# Patient Record
Sex: Female | Born: 2000
Health system: Southern US, Community
[De-identification: ages and names within clinical notes are randomized; demographics above are authoritative.]

## PROBLEM LIST (undated history)

## (undated) DIAGNOSIS — E282 Polycystic ovarian syndrome: Secondary | ICD-10-CM

## (undated) DIAGNOSIS — E039 Hypothyroidism, unspecified: Secondary | ICD-10-CM

## (undated) HISTORY — PX: TONSILLECTOMY: SHX5217

## (undated) HISTORY — DX: Polycystic ovarian syndrome: E28.2

## (undated) HISTORY — DX: Hypothyroidism, unspecified: E03.9

---
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
## 2018-01-08 DIAGNOSIS — M25562 Pain in left knee: Secondary | ICD-10-CM | POA: Diagnosis not present

## 2018-01-08 DIAGNOSIS — R262 Difficulty in walking, not elsewhere classified: Secondary | ICD-10-CM | POA: Diagnosis not present

## 2018-01-08 DIAGNOSIS — M6281 Muscle weakness (generalized): Secondary | ICD-10-CM | POA: Diagnosis not present

## 2018-01-10 DIAGNOSIS — M25562 Pain in left knee: Secondary | ICD-10-CM | POA: Diagnosis not present

## 2018-01-10 DIAGNOSIS — M6281 Muscle weakness (generalized): Secondary | ICD-10-CM | POA: Diagnosis not present

## 2018-01-10 DIAGNOSIS — R262 Difficulty in walking, not elsewhere classified: Secondary | ICD-10-CM | POA: Diagnosis not present

## 2018-01-15 DIAGNOSIS — M6281 Muscle weakness (generalized): Secondary | ICD-10-CM | POA: Diagnosis not present

## 2018-01-15 DIAGNOSIS — R262 Difficulty in walking, not elsewhere classified: Secondary | ICD-10-CM | POA: Diagnosis not present

## 2018-01-15 DIAGNOSIS — M25562 Pain in left knee: Secondary | ICD-10-CM | POA: Diagnosis not present

## 2018-01-17 DIAGNOSIS — R262 Difficulty in walking, not elsewhere classified: Secondary | ICD-10-CM | POA: Diagnosis not present

## 2018-01-17 DIAGNOSIS — M25562 Pain in left knee: Secondary | ICD-10-CM | POA: Diagnosis not present

## 2018-01-17 DIAGNOSIS — M6281 Muscle weakness (generalized): Secondary | ICD-10-CM | POA: Diagnosis not present

## 2018-01-22 DIAGNOSIS — M6281 Muscle weakness (generalized): Secondary | ICD-10-CM | POA: Diagnosis not present

## 2018-01-22 DIAGNOSIS — R262 Difficulty in walking, not elsewhere classified: Secondary | ICD-10-CM | POA: Diagnosis not present

## 2018-01-22 DIAGNOSIS — M25562 Pain in left knee: Secondary | ICD-10-CM | POA: Diagnosis not present

## 2018-01-24 DIAGNOSIS — R262 Difficulty in walking, not elsewhere classified: Secondary | ICD-10-CM | POA: Diagnosis not present

## 2018-01-24 DIAGNOSIS — M25562 Pain in left knee: Secondary | ICD-10-CM | POA: Diagnosis not present

## 2018-01-24 DIAGNOSIS — M6281 Muscle weakness (generalized): Secondary | ICD-10-CM | POA: Diagnosis not present

## 2018-01-29 DIAGNOSIS — M25562 Pain in left knee: Secondary | ICD-10-CM | POA: Diagnosis not present

## 2018-01-29 DIAGNOSIS — M25662 Stiffness of left knee, not elsewhere classified: Secondary | ICD-10-CM | POA: Diagnosis not present

## 2018-01-29 DIAGNOSIS — M6281 Muscle weakness (generalized): Secondary | ICD-10-CM | POA: Diagnosis not present

## 2018-01-29 DIAGNOSIS — S83005A Unspecified dislocation of left patella, initial encounter: Secondary | ICD-10-CM | POA: Diagnosis not present

## 2018-01-31 DIAGNOSIS — M6281 Muscle weakness (generalized): Secondary | ICD-10-CM | POA: Diagnosis not present

## 2018-01-31 DIAGNOSIS — M25562 Pain in left knee: Secondary | ICD-10-CM | POA: Diagnosis not present

## 2018-01-31 DIAGNOSIS — R262 Difficulty in walking, not elsewhere classified: Secondary | ICD-10-CM | POA: Diagnosis not present

## 2018-02-06 DIAGNOSIS — M6281 Muscle weakness (generalized): Secondary | ICD-10-CM | POA: Diagnosis not present

## 2018-02-06 DIAGNOSIS — M25562 Pain in left knee: Secondary | ICD-10-CM | POA: Diagnosis not present

## 2018-02-06 DIAGNOSIS — R262 Difficulty in walking, not elsewhere classified: Secondary | ICD-10-CM | POA: Diagnosis not present

## 2018-02-08 DIAGNOSIS — M25562 Pain in left knee: Secondary | ICD-10-CM | POA: Diagnosis not present

## 2018-02-08 DIAGNOSIS — R262 Difficulty in walking, not elsewhere classified: Secondary | ICD-10-CM | POA: Diagnosis not present

## 2018-02-08 DIAGNOSIS — M6281 Muscle weakness (generalized): Secondary | ICD-10-CM | POA: Diagnosis not present

## 2018-02-13 DIAGNOSIS — R262 Difficulty in walking, not elsewhere classified: Secondary | ICD-10-CM | POA: Diagnosis not present

## 2018-02-13 DIAGNOSIS — M25562 Pain in left knee: Secondary | ICD-10-CM | POA: Diagnosis not present

## 2018-02-13 DIAGNOSIS — M6281 Muscle weakness (generalized): Secondary | ICD-10-CM | POA: Diagnosis not present

## 2018-03-05 ENCOUNTER — Ambulatory Visit: Payer: 59 | Admitting: Family Medicine

## 2018-03-05 ENCOUNTER — Encounter: Payer: Self-pay | Admitting: Family Medicine

## 2018-03-05 DIAGNOSIS — H6121 Impacted cerumen, right ear: Secondary | ICD-10-CM

## 2018-03-05 DIAGNOSIS — H6120 Impacted cerumen, unspecified ear: Secondary | ICD-10-CM | POA: Insufficient documentation

## 2018-03-05 DIAGNOSIS — H6982 Other specified disorders of Eustachian tube, left ear: Secondary | ICD-10-CM | POA: Diagnosis not present

## 2018-03-05 DIAGNOSIS — H6980 Other specified disorders of Eustachian tube, unspecified ear: Secondary | ICD-10-CM | POA: Insufficient documentation

## 2018-03-05 MED ORDER — FLUTICASONE PROPIONATE 50 MCG/ACT NA SUSP: 2.0000 | Freq: Every day | NASAL | 1 refills | Status: DC

## 2018-03-05 NOTE — Assessment & Plan Note (Signed)
Right side - with intermittent pain  Suspect early uri or allergies H/o tubes as a child   Px flonase to use 2-4 wk and then prn  Ibuprofen prn  Update if not starting to improve in a week or if worsening

## 2018-03-05 NOTE — Patient Instructions (Signed)
I think you have middle ear fluid (ETD)  This may be from allergies or an early cold Try flonase daily for 2-4 weeks  Also lozenges with zinc may help for the first 2 days   Drink fluids Get rest   For ear pain - ibuprofen over the counter  Update if not starting to improve in a week or if worsening

## 2018-03-05 NOTE — Progress Notes (Signed)
Subjective:    Patient ID: Angie Watkins, female    DOB: [birth year], 18 y.o.   MRN: [id number]  HPI 18 yo pt of NP [facility] with ear pain and nausea /hot feeling   Pulse 108 Temp 98.7  Woke up yesterday am with pain in L ear  Warm wash cloth helped  It continued - but now improved  May have some drainage in throat  Feels muffled- sound /feels full also   Nauseated- makes her hot   No cough  No fever or chills or aches   Had a headache yesterday- better now (was on L as well)   No chance pregnant  No missed period No missed OC pills    Patient Active Problem List   Diagnosis Date Noted  . ETD (eustachian tube dysfunction) 03/05/2018  . Cerumen impaction 03/05/2018   History reviewed. No pertinent past medical history. History reviewed. No pertinent surgical history. Social History   Tobacco Use  . Smoking status: Never Smoker  . Smokeless tobacco: Never Used  Substance Use Topics  . Alcohol use: Never    Frequency: Never  . Drug use: Never   Family History  Problem Relation Age of Onset  . Hypertension Mother   . Asthma Father   . Diabetes Father    No Known Allergies Current Outpatient Medications on File Prior to Visit  Medication Sig Dispense Refill  . levothyroxine (SYNTHROID, LEVOTHROID) 50 MCG tablet Take 1 tablet (50 mcg total) by mouth daily. 90 tablet 3  . norgestimate-ethinyl estradiol (ORTHO-CYCLEN,SPRINTEC,PREVIFEM) 0.25-35 MG-MCG tablet Take 1 tablet by mouth daily. 3 Package 4   No current facility-administered medications on file prior to visit.     Review of Systems  Constitutional: Negative for activity change, appetite change, fatigue, fever and unexpected weight change.  HENT: Positive for ear pain, hearing loss and postnasal drip. Negative for congestion, ear discharge, facial swelling, rhinorrhea, sinus pressure, sinus pain, sore throat, trouble swallowing and voice change.   Eyes: Negative for pain, redness and visual disturbance.    Respiratory: Negative for cough, shortness of breath, wheezing and stridor.   Cardiovascular: Negative for chest pain and palpitations.  Gastrointestinal: Positive for nausea. Negative for abdominal pain, blood in stool, constipation and diarrhea.       Was nauseated This resolved   Endocrine: Negative for polydipsia and polyuria.  Genitourinary: Negative for dysuria, frequency and urgency.  Musculoskeletal: Negative for arthralgias, back pain and myalgias.  Skin: Negative for pallor and rash.  Allergic/Immunologic: Negative for environmental allergies.  Neurological: Negative for dizziness, syncope, light-headedness and headaches.       Was dizzy This is resolved   Hematological: Negative for adenopathy. Does not bruise/bleed easily.  Psychiatric/Behavioral: Negative for decreased concentration and dysphoric mood. The patient is not nervous/anxious.        Objective:   Physical Exam Constitutional:      General: She is not in acute distress.    Appearance: Normal appearance. She is obese. She is not diaphoretic.  HENT:     Head: Normocephalic and atraumatic.     Right Ear: There is impacted cerumen.     Left Ear: Ear canal normal. There is no impacted cerumen.     Ears:     Comments: R total cerumen impaction   L - TM is dull with effusion  Clear canal      Nose: Rhinorrhea present.     Mouth/Throat:     Mouth: Mucous membranes are moist.  Pharynx: Oropharynx is clear. No posterior oropharyngeal erythema.     Comments: pnd Eyes:     General: No scleral icterus.    Extraocular Movements: Extraocular movements intact.     Right eye: No nystagmus.     Left eye: No nystagmus.     Conjunctiva/sclera: Conjunctivae normal.     Pupils: Pupils are equal, round, and reactive to light.  Neck:     Musculoskeletal: Normal range of motion. No neck rigidity.     Vascular: No carotid bruit.  Cardiovascular:     Rate and Rhythm: Regular rhythm. Tachycardia present.  Pulmonary:      Effort: Pulmonary effort is normal.     Breath sounds: Normal breath sounds. No wheezing.  Lymphadenopathy:     Cervical: No cervical adenopathy.  Neurological:     General: No focal deficit present.     Mental Status: She is alert.     Cranial Nerves: No cranial nerve deficit.     Coordination: Coordination normal.  Psychiatric:        Mood and Affect: Mood normal.           Assessment & Plan:   Problem List Items Addressed This Visit      Nervous and Auditory   ETD (eustachian tube dysfunction)    Right side - with intermittent pain  Suspect early uri or allergies H/o tubes as a child   Px flonase to use 2-4 wk and then prn  Ibuprofen prn  Update if not starting to improve in a week or if worsening         Cerumen impaction    R ear  Incidental finding  inst to get debrox or similar agent otc and use as directed F/u for irrigation if needed in the future

## 2018-03-05 NOTE — Assessment & Plan Note (Signed)
R ear  Incidental finding  inst to get debrox or similar agent otc and use as directed F/u for irrigation if needed in the future

## 2018-03-27 ENCOUNTER — Other Ambulatory Visit: Payer: Self-pay | Admitting: Family Medicine

## 2018-05-03 ENCOUNTER — Other Ambulatory Visit: Payer: Self-pay | Admitting: Family Medicine

## 2018-08-17 ENCOUNTER — Other Ambulatory Visit: Payer: Self-pay | Admitting: Family Medicine

## 2018-08-17 DIAGNOSIS — E039 Hypothyroidism, unspecified: Secondary | ICD-10-CM

## 2018-09-07 ENCOUNTER — Encounter: Payer: Self-pay | Admitting: Family Medicine

## 2018-09-07 ENCOUNTER — Other Ambulatory Visit: Payer: Self-pay

## 2018-09-07 ENCOUNTER — Ambulatory Visit (INDEPENDENT_AMBULATORY_CARE_PROVIDER_SITE_OTHER): Payer: 59 | Admitting: Family Medicine

## 2018-09-07 VITALS — BP 118/78 | HR 97 | Temp 98.7°F | Resp 18 | Ht 64.5 in | Wt 247.2 lb

## 2018-09-07 DIAGNOSIS — E039 Hypothyroidism, unspecified: Secondary | ICD-10-CM | POA: Diagnosis not present

## 2018-09-07 DIAGNOSIS — Z0000 Encounter for general adult medical examination without abnormal findings: Secondary | ICD-10-CM | POA: Diagnosis not present

## 2018-09-07 DIAGNOSIS — E559 Vitamin D deficiency, unspecified: Secondary | ICD-10-CM | POA: Diagnosis not present

## 2018-09-07 DIAGNOSIS — E282 Polycystic ovarian syndrome: Secondary | ICD-10-CM | POA: Diagnosis not present

## 2018-09-07 DIAGNOSIS — E611 Iron deficiency: Secondary | ICD-10-CM

## 2018-09-07 DIAGNOSIS — R221 Localized swelling, mass and lump, neck: Secondary | ICD-10-CM

## 2018-09-07 MED ORDER — NORETHINDRONE-ETH ESTRADIOL 1-35 MG-MCG PO TABS: 1.0000 | ORAL_TABLET | Freq: Every day | ORAL | 4 refills | Status: DC

## 2018-09-07 NOTE — Progress Notes (Signed)
Subjective:    Patient ID: Angie Watkins, female    DOB: 05/08/2000, 18 y.o.   MRN: [id number]  HPI This is a 18 yo female who presents today for annual exam. Doing ok, doing online classes, working at a pool.   Last CPE- last year Tdap- 10/19/2009 Flu- not every year Eye- regular Dental- regular Exercise- walking, was going to the gym pre-pandemic Diet- has been watching her diet, making better food choices  Hypothyroidism- has been off her levothyroxine for about a month. Feels full in her neck.   PCOS- taking her pills regularly until last month. Increased flow x 3 days, regular, occasional breakthrough bleeding.   Headaches- more frequent, frontal, no light or sound sensitivity, doesn't usually take anything. Usually goes away with in a couple of hours. Thinks related to her thyroid.   No past medical history on file. No past surgical history on file. Family History  Problem Relation Age of Onset  . Hypertension Mother   . Asthma Father   . Diabetes Father    Social History   Tobacco Use  . Smoking status: Never Smoker  . Smokeless tobacco: Never Used  Substance Use Topics  . Alcohol use: Never    Frequency: Never  . Drug use: Never      Review of Systems  Constitutional: Negative.   HENT: Negative.   Eyes: Negative.   Respiratory: Negative.   Cardiovascular: Negative.   Gastrointestinal: Negative.   Endocrine: Negative.   Genitourinary: Negative.   Musculoskeletal: Positive for neck pain (occaional with frontal fullness).  Skin: Negative.   Allergic/Immunologic: Negative.   Neurological: Negative.   Hematological: Negative.   Psychiatric/Behavioral: Negative.        Objective:   Physical Exam Physical Exam  Constitutional: She is oriented to person, place, and time. She appears well-developed and well-nourished. No distress. Obese. HENT:  Head: Normocephalic and atraumatic.  Right Ear: External ear normal.  Left Ear: External ear normal.   Nose: Nose normal.  Mouth/Throat: Oropharynx is clear and moist. No oropharyngeal exudate.  Eyes: Conjunctivae are normal. Pupils are equal, round, and reactive to light.  Neck: Normal range of motion. Neck supple. No JVD present. No thyromegaly present.  Cardiovascular: Normal rate, regular rhythm, normal heart sounds and intact distal pulses.   Pulmonary/Chest: Effort normal and breath sounds normal. Right breast exhibits no inverted nipple, no mass, no nipple discharge, no skin change and no tenderness. Left breast exhibits no inverted nipple, no mass, no nipple discharge, no skin change and no tenderness. Breasts are symmetrical.  Abdominal: Soft. Bowel sounds are normal. She exhibits no distension and no mass. There is no tenderness. There is no rebound and no guarding.  Musculoskeletal: Normal range of motion. She exhibits no edema or tenderness.  Lymphadenopathy:    She has no cervical adenopathy.  Neurological: She is alert and oriented to person, place, and time. She has normal reflexes.  Skin: Skin is warm and dry. She is not diaphoretic.  Psychiatric: She has a normal mood and affect. Her behavior is normal. Judgment and thought content normal.  Vitals reviewed.   BP 118/78   Pulse 97   Temp 98.7 F (37.1 C)   Resp 18   Ht 5' 4.5" (1.638 m)   Wt 247 lb 4 oz (112.2 kg)   LMP 08/22/2018   BMI 41.78 kg/m      Wt Readings from Last 3 Encounters:  09/07/18 247 lb 4 oz (112.2 kg) (>99 %, Z=  2.49)*  03/05/18 268 lb 8 oz (121.8 kg) (>99 %, Z= 2.63)*  11/13/17 272 lb 12.8 oz (123.7 kg) (>99 %, Z= 2.64)*   * Growth percentiles are based on CDC (Girls, 2-20 Years) data.   Depression screen [facility] 2/9 09/07/2018 09/04/2017  Decreased Interest 0 0  Down, Depressed, Hopeless 0 0  PHQ - 2 Score 0 0    Assessment & Plan:  1. Annual physical exam - Discussed and encouraged healthy lifestyle choices- adequate sleep, regular exercise, stress management and healthy food choices.  -  Working on weight loss and has lost 25 pounds  2. PCOS (polycystic ovarian syndrome) - menses continue to be heavy, will change her OCPs, she will let me know if not improved with 3 packs - CBC with Differential/Platelet - Lipid panel - VITAMIN D 25 Hydroxy (Vit-D Deficiency, Fractures) - Hemoglobin A1c - TSH - norethindrone-ethinyl estradiol 1/35 (ORTHO-NOVUM) tablet; Take 1 tablet by mouth daily.  Dispense: 3 Package; Refill: 4  3. Vitamin D deficiency - CBC with Differential/Platelet - Lipid panel - VITAMIN D 25 Hydroxy (Vit-D Deficiency, Fractures) - Hemoglobin A1c - TSH  4. Hypothyroidism, unspecified type - discussed strategies for improved medication compliance - CBC with Differential/Platelet - Lipid panel - VITAMIN D 25 Hydroxy (Vit-D Deficiency, Fractures) - Hemoglobin A1c - TSH  5. Low iron - CBC with Differential/Platelet - Lipid panel - VITAMIN D 25 Hydroxy (Vit-D Deficiency, Fractures) - Hemoglobin A1c - TSH  6. Neck fullness - I did not appreciate fullness on exam, this comes goes per patient and area of anterior neck feels inflammed - US THYROID; Future - CBC with Differential/Platelet - Lipid panel - VITAMIN D 25 Hydroxy (Vit-D Deficiency, Fractures) - Hemoglobin A1c - TSH   Clarene Reamer, FNP-BC  [facility]  09/07/2018 5:03 PM

## 2018-09-07 NOTE — Patient Instructions (Signed)
Good to see you today- keep up the good work with your healthy food choices!  I will send you a message about your lab work  I have sent in a prescription for a new birth control pill- just start the new pack when you finish the old one. Let me know how your periods are doing after 3 packs.    Health Maintenance, Female Adopting a healthy lifestyle and getting preventive care are important in promoting health and wellness. Ask your health care provider about:  The right schedule for you to have regular tests and exams.  Things you can do on your own to prevent diseases and keep yourself healthy. What should I know about diet, weight, and exercise? Eat a healthy diet   Eat a diet that includes plenty of vegetables, fruits, low-fat dairy products, and lean protein.  Do not eat a lot of foods that are high in solid fats, added sugars, or sodium. Maintain a healthy weight Body mass index (BMI) is used to identify weight problems. It estimates body fat based on height and weight. Your health care provider can help determine your BMI and help you achieve or maintain a healthy weight. Get regular exercise Get regular exercise. This is one of the most important things you can do for your health. Most adults should:  Exercise for at least 150 minutes each week. The exercise should increase your heart rate and make you sweat (moderate-intensity exercise).  Do strengthening exercises at least twice a week. This is in addition to the moderate-intensity exercise.  Spend less time sitting. Even light physical activity can be beneficial. Watch cholesterol and blood lipids Have your blood tested for lipids and cholesterol at 20 years of age, then have this test every 5 years. Have your cholesterol levels checked more often if:  Your lipid or cholesterol levels are high.  You are older than 20 years of age.  You are at high risk for heart disease. What should I know about cancer screening?  Depending on your health history and family history, you may need to have cancer screening at various ages. This may include screening for:  Breast cancer.  Cervical cancer.  Colorectal cancer.  Skin cancer.  Lung cancer. What should I know about heart disease, diabetes, and high blood pressure? Blood pressure and heart disease  High blood pressure causes heart disease and increases the risk of stroke. This is more likely to develop in people who have high blood pressure readings, are of African descent, or are overweight.  Have your blood pressure checked: ? Every 3-5 years if you are 63-66 years of age. ? Every year if you are 26 years old or older. Diabetes Have regular diabetes screenings. This checks your fasting blood sugar level. Have the screening done:  Once every three years after age 109 if you are at a normal weight and have a low risk for diabetes.  More often and at a younger age if you are overweight or have a high risk for diabetes. What should I know about preventing infection? Hepatitis B If you have a higher risk for hepatitis B, you should be screened for this virus. Talk with your health care provider to find out if you are at risk for hepatitis B infection. Hepatitis C Testing is recommended for:  Everyone born from [birth year] through 1967.  Anyone with known risk factors for hepatitis C. Sexually transmitted infections (STIs)  Get screened for STIs, including gonorrhea and chlamydia, if: ? You  are sexually active and are younger than 20 years of age. ? You are older than 20 years of age and your health care provider tells you that you are at risk for this type of infection. ? Your sexual activity has changed since you were last screened, and you are at increased risk for chlamydia or gonorrhea. Ask your health care provider if you are at risk.  Ask your health care provider about whether you are at high risk for HIV. Your health care provider may recommend a  prescription medicine to help prevent HIV infection. If you choose to take medicine to prevent HIV, you should first get tested for HIV. You should then be tested every 3 months for as long as you are taking the medicine. Pregnancy  If you are about to stop having your period (premenopausal) and you may become pregnant, seek counseling before you get pregnant.  Take 400 to 800 micrograms (mcg) of folic acid every day if you become pregnant.  Ask for birth control (contraception) if you want to prevent pregnancy. Osteoporosis and menopause Osteoporosis is a disease in which the bones lose minerals and strength with aging. This can result in bone fractures. If you are 42 years old or older, or if you are at risk for osteoporosis and fractures, ask your health care provider if you should:  Be screened for bone loss.  Take a calcium or vitamin D supplement to lower your risk of fractures.  Be given hormone replacement therapy (HRT) to treat symptoms of menopause. Follow these instructions at home: Lifestyle  Do not use any products that contain nicotine or tobacco, such as cigarettes, e-cigarettes, and chewing tobacco. If you need help quitting, ask your health care provider.  Do not use street drugs.  Do not share needles.  Ask your health care provider for help if you need support or information about quitting drugs. Alcohol use  Do not drink alcohol if: ? Your health care provider tells you not to drink. ? You are pregnant, may be pregnant, or are planning to become pregnant.  If you drink alcohol: ? Limit how much you use to 0-1 drink a day. ? Limit intake if you are breastfeeding.  Be aware of how much alcohol is in your drink. In the [place] one drink equals one 12 oz bottle of beer (355 mL), one 5 oz glass of wine (148 mL), or one 1 oz glass of hard liquor (44 mL). General instructions  Schedule regular health, dental, and eye exams.  Stay current with your vaccines.   Tell your health care provider if: ? You often feel depressed. ? You have ever been abused or do not feel safe at home. Summary  Adopting a healthy lifestyle and getting preventive care are important in promoting health and wellness.  Follow your health care provider's instructions about healthy diet, exercising, and getting tested or screened for diseases.  Follow your health care provider's instructions on monitoring your cholesterol and blood pressure. This information is not intended to replace advice given to you by your health care provider. Make sure you discuss any questions you have with your health care provider. Document Released: 08/30/2010 Document Revised: 02/07/2018 Document Reviewed: 02/07/2018 Elsevier Patient Education  2020 [employer].

## 2018-09-08 LAB — CBC WITH DIFFERENTIAL/PLATELET
Absolute Monocytes: 629 cells/uL (ref 200–950)
Basophils Absolute: 51 cells/uL (ref 0–200)
Basophils Relative: 0.6 %
Eosinophils Absolute: 34 cells/uL (ref 15–500)
Eosinophils Relative: 0.4 %
HCT: 41.9 % (ref 35.0–45.0)
Hemoglobin: 13.6 g/dL (ref 11.7–15.5)
Lymphs Abs: 2797 cells/uL (ref 850–3900)
MCH: 27.6 pg (ref 27.0–33.0)
MCHC: 32.5 g/dL (ref 32.0–36.0)
MCV: 85.2 fL (ref 80.0–100.0)
MPV: 11.6 fL (ref 7.5–12.5)
Monocytes Relative: 7.4 %
Neutro Abs: 4990 cells/uL (ref 1500–7800)
Neutrophils Relative %: 58.7 %
Platelets: 302 10*3/uL (ref 140–400)
RBC: 4.92 10*6/uL (ref 3.80–5.10)
RDW: 13 % (ref 11.0–15.0)
Total Lymphocyte: 32.9 %
WBC: 8.5 10*3/uL (ref 3.8–10.8)

## 2018-09-08 LAB — HEMOGLOBIN A1C
Hgb A1c MFr Bld: 5.1 % of total Hgb (ref <5.7)
Mean Plasma Glucose: 100 (calc)
eAG (mmol/L): 5.5 (calc)

## 2018-09-08 LAB — LIPID PANEL
Cholesterol: 166 mg/dL (ref <170)
HDL: 46 mg/dL (ref >45)
LDL Cholesterol (Calc): 105 mg/dL (calc) (ref <110)
Non-HDL Cholesterol (Calc): 120 mg/dL (calc) — ABNORMAL HIGH (ref <120)
Total CHOL/HDL Ratio: 3.6 (calc) (ref <5.0)
Triglycerides: 66 mg/dL (ref <90)

## 2018-09-08 LAB — TSH: TSH: 2.96 mIU/L

## 2018-09-08 LAB — VITAMIN D 25 HYDROXY (VIT D DEFICIENCY, FRACTURES): Vit D, 25-Hydroxy: 35 ng/mL (ref 30–100)

## 2018-09-10 ENCOUNTER — Telehealth: Payer: Self-pay | Admitting: Family Medicine

## 2018-09-10 ENCOUNTER — Encounter: Payer: Self-pay | Admitting: Family Medicine

## 2018-09-10 NOTE — Telephone Encounter (Signed)
Patient scheduled 3 month labs.  And would like to know if you want her to fast for those     C/B # [phone]

## 2018-09-11 NOTE — Telephone Encounter (Signed)
Please call her and let her know that she does not need to fast.

## 2018-09-12 NOTE — Telephone Encounter (Signed)
Sent message through mychart

## 2018-09-17 ENCOUNTER — Encounter: Payer: Self-pay | Admitting: Family Medicine

## 2018-09-17 ENCOUNTER — Ambulatory Visit
Admission: RE | Admit: 2018-09-17 | Discharge: 2018-09-17 | Disposition: A | Discharge status: Home / Self Care | Payer: 59 | Source: Ambulatory Visit | Attending: Family Medicine | Admitting: Family Medicine

## 2018-09-17 DIAGNOSIS — R221 Localized swelling, mass and lump, neck: Secondary | ICD-10-CM

## 2018-12-04 ENCOUNTER — Other Ambulatory Visit: Payer: Self-pay | Admitting: Family Medicine

## 2018-12-04 DIAGNOSIS — E039 Hypothyroidism, unspecified: Secondary | ICD-10-CM

## 2018-12-06 ENCOUNTER — Telehealth: Payer: Self-pay

## 2018-12-06 NOTE — Telephone Encounter (Signed)
Left detailed VM w COVID screen and back door lab info   

## 2018-12-07 ENCOUNTER — Other Ambulatory Visit: Payer: 59

## 2018-12-10 ENCOUNTER — Other Ambulatory Visit: Payer: 59

## 2018-12-10 ENCOUNTER — Other Ambulatory Visit: Payer: Self-pay | Admitting: Family Medicine

## 2018-12-10 DIAGNOSIS — E039 Hypothyroidism, unspecified: Secondary | ICD-10-CM

## 2018-12-11 ENCOUNTER — Other Ambulatory Visit: Payer: 59

## 2018-12-26 ENCOUNTER — Encounter: Payer: Self-pay | Admitting: Family Medicine

## 2019-01-28 ENCOUNTER — Other Ambulatory Visit: Payer: 59

## 2019-01-29 ENCOUNTER — Other Ambulatory Visit: Payer: Self-pay

## 2019-01-29 ENCOUNTER — Other Ambulatory Visit (INDEPENDENT_AMBULATORY_CARE_PROVIDER_SITE_OTHER): Payer: 59

## 2019-01-29 DIAGNOSIS — E039 Hypothyroidism, unspecified: Secondary | ICD-10-CM | POA: Diagnosis not present

## 2019-01-30 ENCOUNTER — Other Ambulatory Visit: Payer: Self-pay | Admitting: Family Medicine

## 2019-01-30 ENCOUNTER — Encounter: Payer: Self-pay | Admitting: Family Medicine

## 2019-01-30 LAB — TSH: TSH: 2.39 u[IU]/mL (ref 0.35–5.50)

## 2019-03-07 ENCOUNTER — Telehealth: Payer: Self-pay | Admitting: Radiology

## 2019-03-07 NOTE — Telephone Encounter (Signed)
Patient called with concerns about a venipuncter site from Dec, 1. She said, it never discolored but did have a bump at the site. It still is sore at times with the touch.  I told her it should have been resolved by this time. I asked if she wanted an appt or a phone note to be sent to D. Leone Payor. Patient asked to do the phone note at this point.

## 2019-03-08 NOTE — Telephone Encounter (Signed)
Please call patient and see if area of concern is improving, getting worse or staying the same? She can apply warm compresses. If pain persists or worsens, will need to be seen in the office.

## 2019-03-08 NOTE — Telephone Encounter (Signed)
Noted  

## 2019-03-08 NOTE — Telephone Encounter (Signed)
Spoke with patient, she is going to start warm compresses today and do them off and on all weekend and will call on Monday is not seeing any improvement.   Pt states that there is no discoloration to the area, bump is not visible to the eye it can only be palpated. Pt states that its very tender to the touch, and when pressed on is very painful and takes a while to stop hurting after being messed with. Pt denies any pain with bending at her elbow, no known discomfort when lifting heavy objects. I advised that if site worsens or becomes visibly worse then to send Angie Watkins a picture on mychart.   Patient will call and give an update next week.

## 2019-08-26 ENCOUNTER — Encounter: Payer: Self-pay | Admitting: Family Medicine

## 2019-08-30 ENCOUNTER — Encounter: Payer: Self-pay | Admitting: Family Medicine

## 2019-08-30 ENCOUNTER — Other Ambulatory Visit: Payer: Self-pay

## 2019-08-30 ENCOUNTER — Ambulatory Visit (INDEPENDENT_AMBULATORY_CARE_PROVIDER_SITE_OTHER): Payer: No Typology Code available for payment source | Admitting: Family Medicine

## 2019-08-30 VITALS — BP 110/80 | HR 84 | Temp 97.7°F | Wt 247.8 lb

## 2019-08-30 DIAGNOSIS — K219 Gastro-esophageal reflux disease without esophagitis: Secondary | ICD-10-CM | POA: Diagnosis not present

## 2019-08-30 DIAGNOSIS — G43009 Migraine without aura, not intractable, without status migrainosus: Secondary | ICD-10-CM | POA: Diagnosis not present

## 2019-08-30 MED ORDER — SUMATRIPTAN SUCCINATE 50 MG PO TABS: ORAL_TABLET | ORAL | 1 refills | Status: DC

## 2019-08-30 NOTE — Patient Instructions (Addendum)
Good to see you today  Get your sumatriptan filled- take one tablet and 2 over the counter naproxen with food and a full glass of water, can repeat sumatriptan in 2 hours if needed.   Increase water intake, can drink G2, Propel, Smart Water, etc  If not better in a couple of days, let me know  Keep a log of your headaches- when they occur, how long they last, what makes them go away  You tube channels- Dr. Wylene SimmerJason Fung, Dr. Shanda HowellsEric Westman  Migraine Headache A migraine headache is a very strong throbbing pain on one side or both sides of your head. This type of headache can also cause other symptoms. It can last from 4 hours to 3 days. Talk with your doctor about what things may bring on (trigger) this condition. What are the causes? The exact cause of this condition is not known. This condition may be triggered or caused by:  Drinking alcohol.  Smoking.  Taking medicines, such as: ? Medicine used to treat chest pain (nitroglycerin). ? Birth control pills. ? Estrogen. ? Some blood pressure medicines.  Eating or drinking certain products.  Doing physical activity. Other things that may trigger a migraine headache include:  Having a menstrual period.  Pregnancy.  Hunger.  Stress.  Not getting enough sleep or getting too much sleep.  Weather changes.  Tiredness (fatigue). What increases the risk?  Being 4725-21 years old.  Being female.  Having a family history of migraine headaches.  Being Caucasian.  Having depression or anxiety.  Being very overweight. What are the signs or symptoms?  A throbbing pain. This pain may: ? Happen in any area of the head, such as on one side or both sides. ? Make it hard to do daily activities. ? Get worse with physical activity. ? Get worse around bright lights or loud noises.  Other symptoms may include: ? Feeling sick to your stomach (nauseous). ? Vomiting. ? Dizziness. ? Being sensitive to bright lights, loud noises, or  smells.  Before you get a migraine headache, you may get warning signs (an aura). An aura may include: ? Seeing flashing lights or having blind spots. ? Seeing bright spots, halos, or zigzag lines. ? Having tunnel vision or blurred vision. ? Having numbness or a tingling feeling. ? Having trouble talking. ? Having weak muscles.  Some people have symptoms after a migraine headache (postdromal phase), such as: ? Tiredness. ? Trouble thinking (concentrating). How is this treated?  Taking medicines that: ? Relieve pain. ? Relieve the feeling of being sick to your stomach. ? Prevent migraine headaches.  Treatment may also include: ? Having acupuncture. ? Avoiding foods that bring on migraine headaches. ? Learning ways to control your body functions (biofeedback). ? Therapy to help you know and deal with negative thoughts (cognitive behavioral therapy). Follow these instructions at home: Medicines  Take over-the-counter and prescription medicines only as told by your doctor.  Ask your doctor if the medicine prescribed to you: ? Requires you to avoid driving or using heavy machinery. ? Can cause trouble pooping (constipation). You may need to take these steps to prevent or treat trouble pooping:  Drink enough fluid to keep your pee (urine) pale yellow.  Take over-the-counter or prescription medicines.  Eat foods that are high in fiber. These include beans, whole grains, and fresh fruits and vegetables.  Limit foods that are high in fat and sugar. These include fried or sweet foods. Lifestyle  Do not drink alcohol.  Do not use any products that contain nicotine or tobacco, such as cigarettes, e-cigarettes, and chewing tobacco. If you need help quitting, ask your doctor.  Get at least 8 hours of sleep every night.  Limit and deal with stress. General instructions      Keep a journal to find out what may bring on your migraine headaches. For example, write down: ? What  you eat and drink. ? How much sleep you get. ? Any change in what you eat or drink. ? Any change in your medicines.  If you have a migraine headache: ? Avoid things that make your symptoms worse, such as bright lights. ? It may help to lie down in a dark, quiet room. ? Do not drive or use heavy machinery. ? Ask your doctor what activities are safe for you.  Keep all follow-up visits as told by your doctor. This is important. Contact a doctor if:  You get a migraine headache that is different or worse than others you have had.  You have more than 15 headache days in one month. Get help right away if:  Your migraine headache gets very bad.  Your migraine headache lasts longer than 72 hours.  You have a fever.  You have a stiff neck.  You have trouble seeing.  Your muscles feel weak or like you cannot control them.  You start to lose your balance a lot.  You start to have trouble walking.  You pass out (faint).  You have a seizure. Summary  A migraine headache is a very strong throbbing pain on one side or both sides of your head. These headaches can also cause other symptoms.  This condition may be treated with medicines and changes to your lifestyle.  Keep a journal to find out what may bring on your migraine headaches.  Contact a doctor if you get a migraine headache that is different or worse than others you have had.  Contact your doctor if you have more than 15 headache days in a month. This information is not intended to replace advice given to you by your health care provider. Make sure you discuss any questions you have with your health care provider. Document Revised: 06/08/2018 Document Reviewed: 03/29/2018 Elsevier Patient Education  2020 [employer]  Gastroesophageal Reflux Disease, Adult Gastroesophageal reflux (GER) happens when acid from the stomach flows up into the tube that connects the mouth and the stomach (esophagus). Normally, food travels  down the esophagus and stays in the stomach to be digested. However, when a person has GER, food and stomach acid sometimes move back up into the esophagus. If this becomes a more serious problem, the person may be diagnosed with a disease called gastroesophageal reflux disease (GERD). GERD occurs when the reflux:  Happens often.  Causes frequent or severe symptoms.  Causes problems such as damage to the esophagus. When stomach acid comes in contact with the esophagus, the acid may cause soreness (inflammation) in the esophagus. Over time, GERD may create small holes (ulcers) in the lining of the esophagus. What are the causes? This condition is caused by a problem with the muscle between the esophagus and the stomach (lower esophageal sphincter, or LES). Normally, the LES muscle closes after food passes through the esophagus to the stomach. When the LES is weakened or abnormal, it does not close properly, and that allows food and stomach acid to go back up into the esophagus. The LES can be weakened by certain dietary substances, medicines,  and medical conditions, including:  Tobacco use.  Pregnancy.  Having a hiatal hernia.  Alcohol use.  Certain foods and beverages, such as coffee, chocolate, onions, and peppermint. What increases the risk? You are more likely to develop this condition if you:  Have an increased body weight.  Have a connective tissue disorder.  Use NSAID medicines. What are the signs or symptoms? Symptoms of this condition include:  Heartburn.  Difficult or painful swallowing.  The feeling of having a lump in the throat.  Abitter taste in the mouth.  Bad breath.  Having a large amount of saliva.  Having an upset or bloated stomach.  Belching.  Chest pain. Different conditions can cause chest pain. Make sure you see your health care provider if you experience chest pain.  Shortness of breath or wheezing.  Ongoing (chronic) cough or a night-time  cough.  Wearing away of tooth enamel.  Weight loss. How is this diagnosed? Your health care provider will take a medical history and perform a physical exam. To determine if you have mild or severe GERD, your health care provider may also monitor how you respond to treatment. You may also have tests, including:  A test to examine your stomach and esophagus with a small camera (endoscopy).  A test thatmeasures the acidity level in your esophagus.  A test thatmeasures how much pressure is on your esophagus.  A barium swallow or modified barium swallow test to show the shape, size, and functioning of your esophagus. How is this treated? The goal of treatment is to help relieve your symptoms and to prevent complications. Treatment for this condition may vary depending on how severe your symptoms are. Your health care provider may recommend:  Changes to your diet.  Medicine.  Surgery. Follow these instructions at home: Eating and drinking   Follow a diet as recommended by your health care provider. This may involve avoiding foods and drinks such as: ? Coffee and tea (with or without caffeine). ? Drinks that containalcohol. ? Energy drinks and sports drinks. ? Carbonated drinks or sodas. ? Chocolate and cocoa. ? Peppermint and mint flavorings. ? Garlic and onions. ? Horseradish. ? Spicy and acidic foods, including peppers, chili powder, curry powder, vinegar, hot sauces, and barbecue sauce. ? Citrus fruit juices and citrus fruits, such as oranges, lemons, and limes. ? Tomato-based foods, such as red sauce, chili, salsa, and pizza with red sauce. ? Fried and fatty foods, such as donuts, french fries, potato chips, and high-fat dressings. ? High-fat meats, such as hot dogs and fatty cuts of red and white meats, such as rib eye steak, sausage, ham, and bacon. ? High-fat dairy items, such as whole milk, butter, and cream cheese.  Eat small, frequent meals instead of large  meals.  Avoid drinking large amounts of liquid with your meals.  Avoid eating meals during the 2-3 hours before bedtime.  Avoid lying down right after you eat.  Do not exercise right after you eat. Lifestyle   Do not use any products that contain nicotine or tobacco, such as cigarettes, e-cigarettes, and chewing tobacco. If you need help quitting, ask your health care provider.  Try to reduce your stress by using methods such as yoga or meditation. If you need help reducing stress, ask your health care provider.  If you are overweight, reduce your weight to an amount that is healthy for you. Ask your health care provider for guidance about a safe weight loss goal. General instructions  Pay attention  to any changes in your symptoms.  Take over-the-counter and prescription medicines only as told by your health care provider. Do not take aspirin, ibuprofen, or other NSAIDs unless your health care provider told you to do so.  Wear loose-fitting clothing. Do not wear anything tight around your waist that causes pressure on your abdomen.  Raise (elevate) the head of your bed about 6 inches (15 cm).  Avoid bending over if this makes your symptoms worse.  Keep all follow-up visits as told by your health care provider. This is important. Contact a health care provider if:  You have: ? New symptoms. ? Unexplained weight loss. ? Difficulty swallowing or it hurts to swallow. ? Wheezing or a persistent cough. ? A hoarse voice.  Your symptoms do not improve with treatment. Get help right away if you:  Have pain in your arms, neck, jaw, teeth, or back.  Feel sweaty, dizzy, or light-headed.  Have chest pain or shortness of breath.  Vomit and your vomit looks like blood or coffee grounds.  Faint.  Have stool that is bloody or black.  Cannot swallow, drink, or eat. Summary  Gastroesophageal reflux happens when acid from the stomach flows up into the esophagus. GERD is a disease  in which the reflux happens often, causes frequent or severe symptoms, or causes problems such as damage to the esophagus.  Treatment for this condition may vary depending on how severe your symptoms are. Your health care provider may recommend diet and lifestyle changes, medicine, or surgery.  Contact a health care provider if you have new or worsening symptoms.  Take over-the-counter and prescription medicines only as told by your health care provider. Do not take aspirin, ibuprofen, or other NSAIDs unless your health care provider told you to do so.  Keep all follow-up visits as told by your health care provider. This is important. This information is not intended to replace advice given to you by your health care provider. Make sure you discuss any questions you have with your health care provider. Document Revised: 08/23/2017 Document Reviewed: 08/23/2017 Elsevier Patient Education  2020 [employer].

## 2019-08-30 NOTE — Progress Notes (Signed)
Subjective:    Patient ID: Angie Watkins, female    DOB: 2000-04-26, 19 y.o.   MRN: [id number]  HPI Chief Complaint  Patient presents with  . Migraine   This is a 19 yo female who presents today with concern for headaches. She has been working for a [employer].   Headaches started out of the blue last month. Pain over right eye, usually pounding, dull then with some sharp pains. Felt lightheaded at work 2-3 times. Headaches every day, wakes up with them. Not sensitive to light or sound, nausea x 2, no vomiting, no aura. Has been taking Excedrin with no relief. Blurred vision for a second or two. Yesterday had watery eye. Went to eye doctor recently, no change in vision, still wears her glasses. No increased stress. Was sleeping well until this week. Trouble falling asleep with headache. No change with weather. Has tried advil, tylenol, excedrin, excedrin extra strength. Took otc medication x 3 days, then stopped. Occasional caffeine. Some water.  Pain currently at a 4 out of 10. Up to 8/10.  She has a friend whose husband had similar symptoms and ended up with a brain tumor.  This has made her a little worried.  Periods regular. Not heavy.  Taking OCPs daily.  Genella Rife- multiple times a week, worse with spicy foods, tomato based sauces.  Has been taking something over-the-counter but is not sure of the name.   Review of Systems Per HPI    Objective:   Physical Exam Vitals reviewed.  Constitutional:      General: She is not in acute distress.    Appearance: Normal appearance. She is obese. She is not ill-appearing, toxic-appearing or diaphoretic.  HENT:     Head: Atraumatic.     Right Ear: Ear canal and external ear normal. There is impacted cerumen.     Left Ear: Tympanic membrane, ear canal and external ear normal.     Nose: Nose normal.     Mouth/Throat:     Mouth: Mucous membranes are moist.     Pharynx: Oropharynx is clear.  Eyes:     Extraocular Movements: Extraocular  movements intact.     Conjunctiva/sclera: Conjunctivae normal.     Pupils: Pupils are equal, round, and reactive to light.  Cardiovascular:     Rate and Rhythm: Normal rate and regular rhythm.     Heart sounds: Normal heart sounds.  Pulmonary:     Breath sounds: Normal breath sounds.  Musculoskeletal:     Cervical back: Normal range of motion and neck supple. No rigidity or tenderness.  Lymphadenopathy:     Cervical: No cervical adenopathy.  Neurological:     Mental Status: She is alert and oriented to person, place, and time.     Cranial Nerves: No cranial nerve deficit.     Sensory: No sensory deficit.     Motor: No weakness.     Coordination: Coordination normal.     Gait: Gait normal.  Psychiatric:        Mood and Affect: Mood normal.        Behavior: Behavior normal.        Thought Content: Thought content normal.        Judgment: Judgment normal.       BP 110/80 (BP Location: Left Arm, Patient Position: Sitting, Cuff Size: Large)   Pulse 84   Temp 97.7 F (36.5 C) (Temporal)   Wt 247 lb 12.8 oz (112.4 kg)   LMP 08/16/2019 (  Approximate)   SpO2 99%   BMI 41.88 kg/m  Wt Readings from Last 3 Encounters:  08/30/19 247 lb 12.8 oz (112.4 kg)  09/07/18 247 lb 4 oz (112.2 kg) (>99 %, Z= 2.49)*  03/05/18 268 lb 8 oz (121.8 kg) (>99 %, Z= 2.63)*   * Growth percentiles are based on CDC (Girls, 2-20 Years) data.       Assessment & Plan:  1. Migraine without aura and without status migrainosus, not intractable - Provided written and verbal information regarding diagnosis and treatment. - offered her Toradol injection, she declined - SUMAtriptan (IMITREX) 50 MG tablet; May repeat in 2 hours if headache persists or recurs. No more than 2 tablets in 24 hours.  Dispense: 10 tablet; Refill: 1 - discussed importance of hydration, adequate sleep - follow up precautions reviewed  2. Gastroesophageal reflux disease, unspecified whether esophagitis present - discussed trigger  avoidance, otc treatments, encouraged weight loss   - upcoming appointment for CPE/ labs  This visit occurred during the SARS-CoV-2 public health emergency.  Safety protocols were in place, including screening questions prior to the visit, additional usage of staff PPE, and extensive cleaning of exam room while observing appropriate contact time as indicated for disinfecting solutions.      Olean Ree, FNP-BC  [facility], [facility]  08/31/2019 10:30 AM

## 2019-09-03 ENCOUNTER — Other Ambulatory Visit: Payer: Self-pay | Admitting: Family Medicine

## 2019-09-03 DIAGNOSIS — G4452 New daily persistent headache (NDPH): Secondary | ICD-10-CM

## 2019-09-04 ENCOUNTER — Ambulatory Visit: Payer: 59 | Admitting: Family Medicine

## 2019-09-09 ENCOUNTER — Encounter: Payer: 59 | Admitting: Family Medicine

## 2019-10-04 ENCOUNTER — Encounter: Payer: 59 | Admitting: Family Medicine

## 2019-10-28 ENCOUNTER — Other Ambulatory Visit: Payer: Self-pay | Admitting: Family Medicine

## 2019-10-28 DIAGNOSIS — E282 Polycystic ovarian syndrome: Secondary | ICD-10-CM

## 2019-10-29 NOTE — Telephone Encounter (Signed)
Last office visit 08/30/2019.  No future appts schedule with PCP.

## 2019-10-30 IMAGING — DX DG FOOT COMPLETE 3+V*L*
3 series · 3 of 3 positions shown · non-contrast
Comparison: None.

CLINICAL DATA: Left foot pain for the past 2 weeks.

EXAM:
LEFT FOOT - COMPLETE 3+ VIEW

[foot ap]
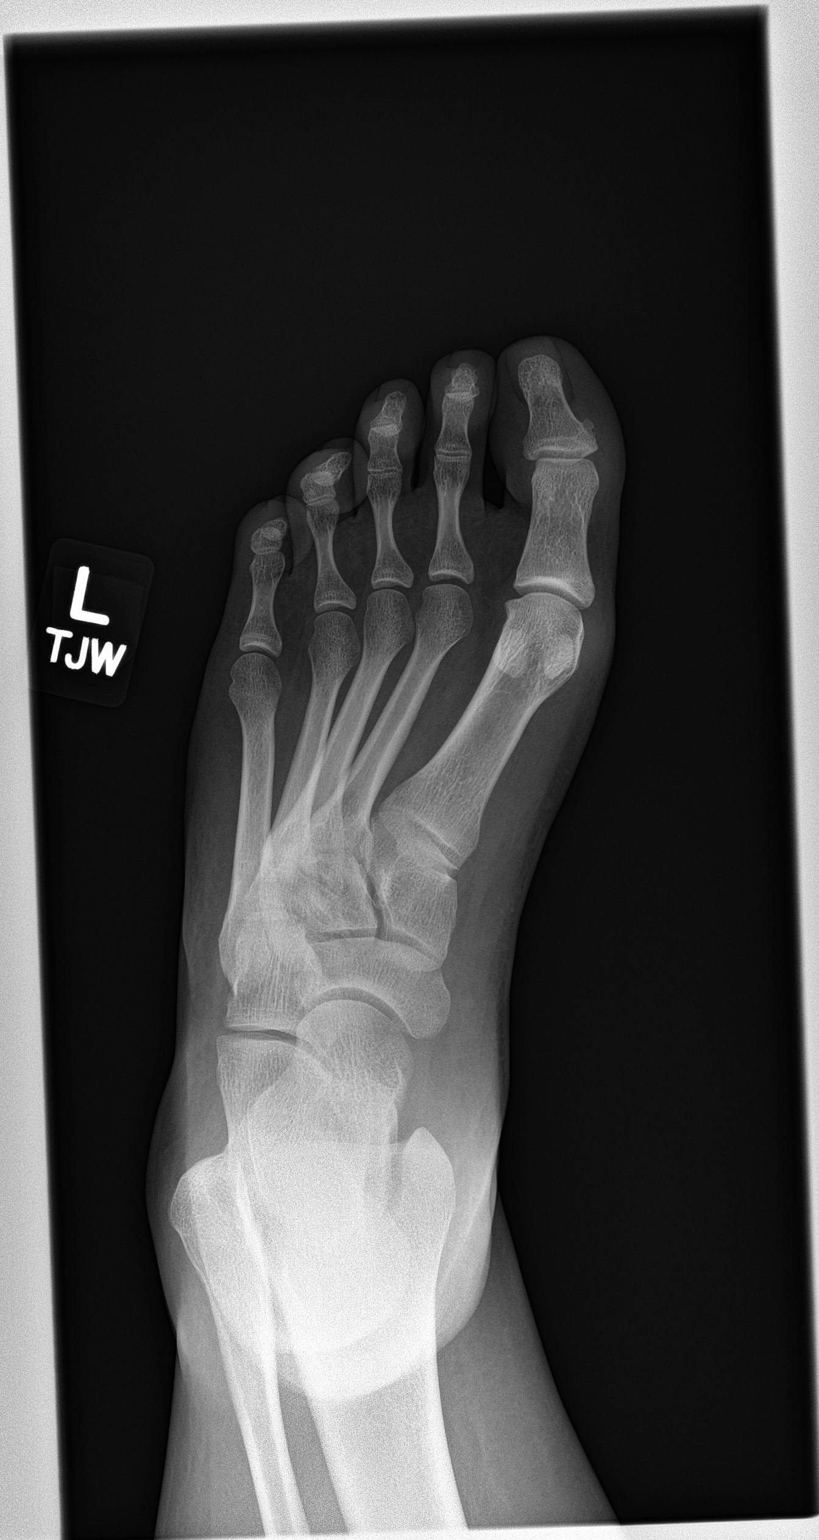

[foot obl]
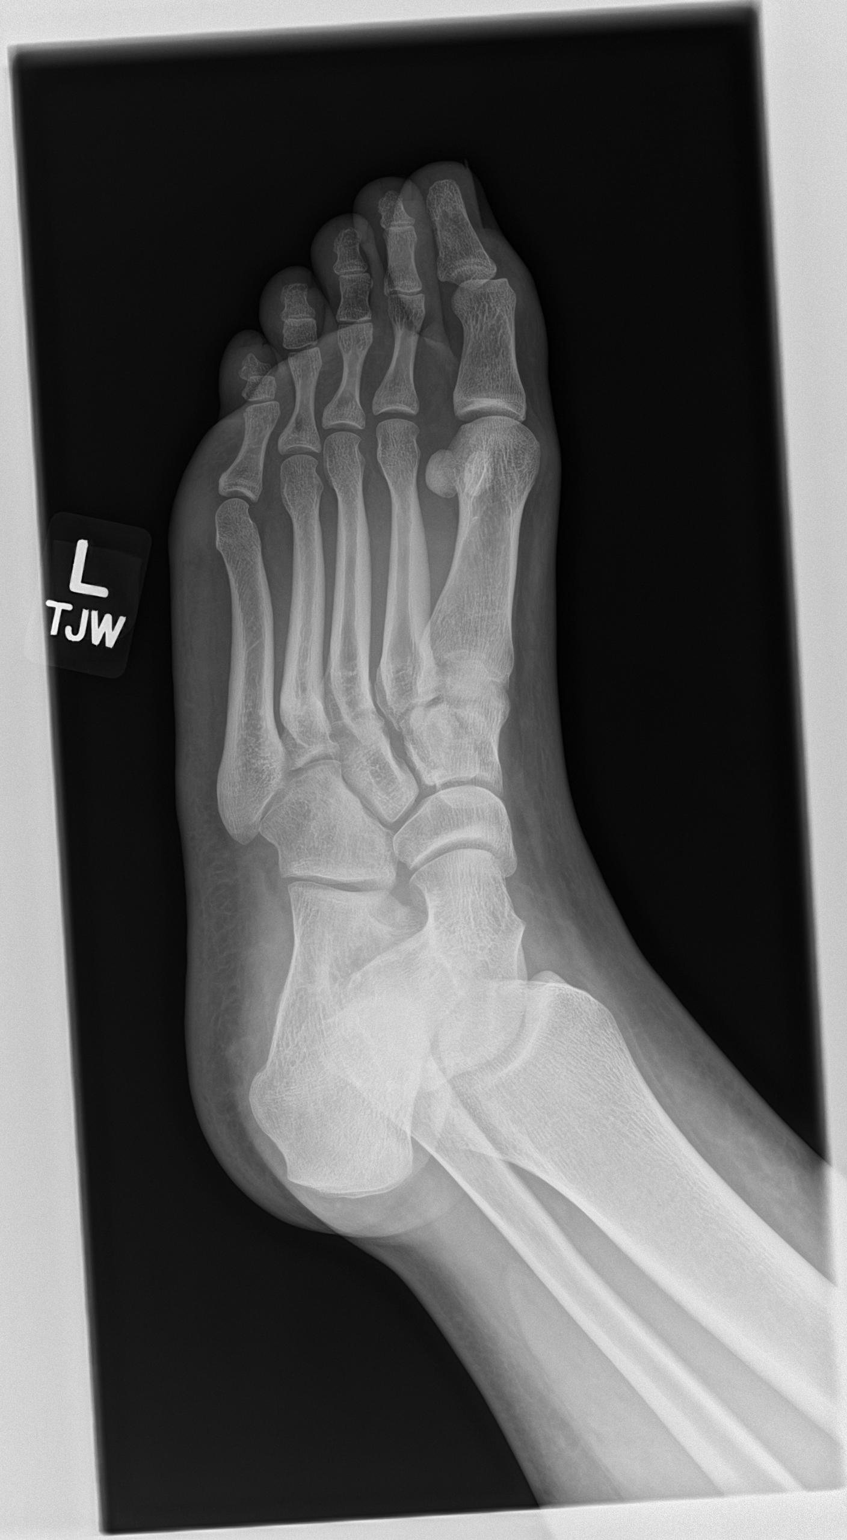

[foot lat]
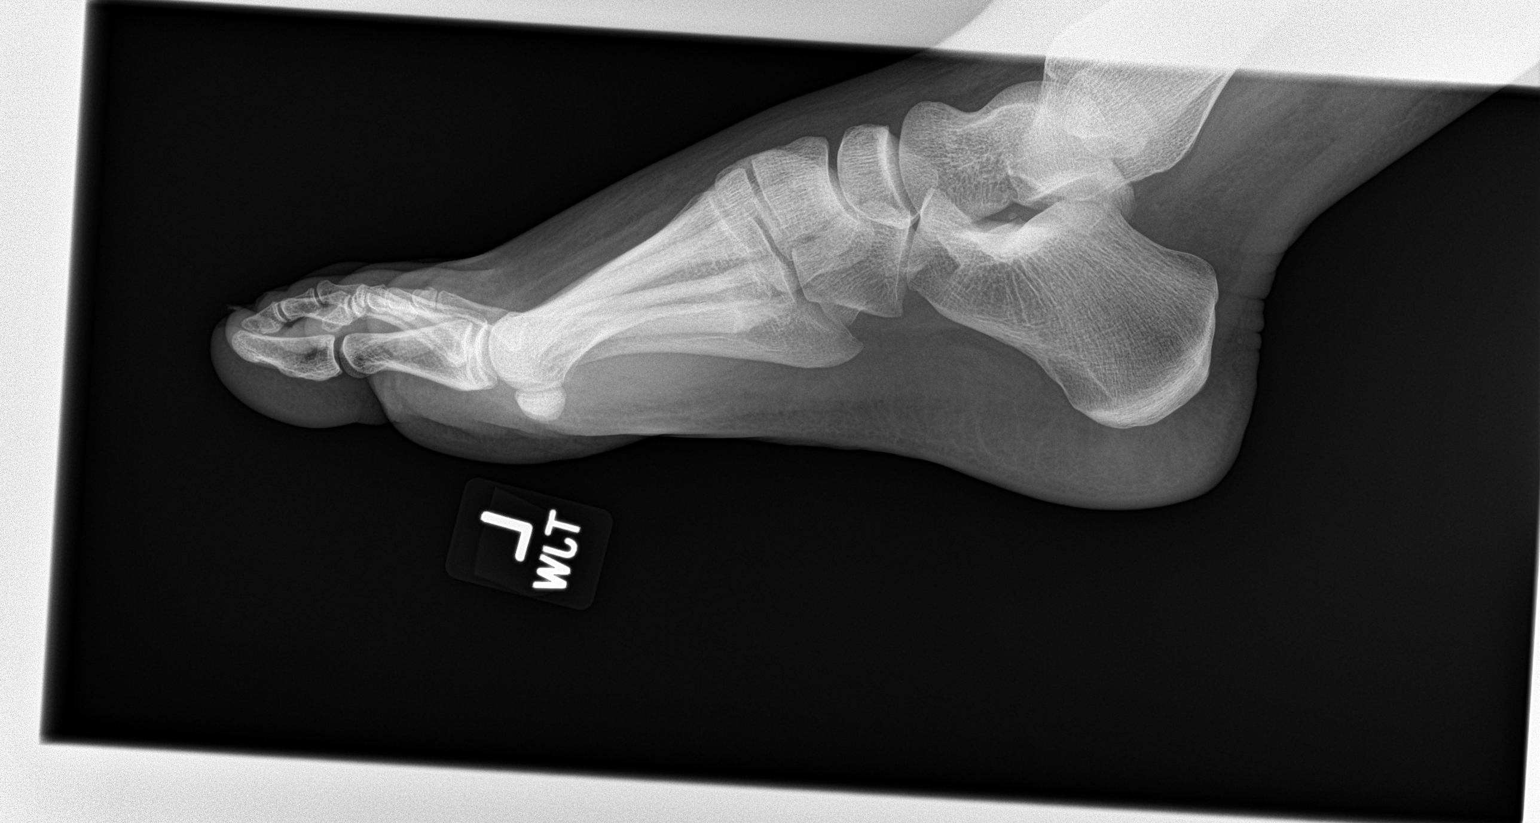

[3 of 3 positions shown; findings below may reference images not displayed]

FINDINGS: There is no evidence of fracture or dislocation. There is no
evidence of arthropathy or other focal bone abnormality. Soft
tissues are unremarkable.
IMPRESSION: Negative.

## 2019-12-12 ENCOUNTER — Encounter: Payer: Self-pay | Admitting: Family Medicine

## 2020-04-29 ENCOUNTER — Ambulatory Visit: Payer: No Typology Code available for payment source | Admitting: Family Medicine

## 2020-08-24 ENCOUNTER — Telehealth: Payer: Self-pay | Admitting: Family Medicine

## 2020-08-24 NOTE — Telephone Encounter (Signed)
Patient formerly seen by Angie Sprang, NP called Requesting TOC. I advised that we are not taking new patients at this time but that we do have two new Providers coming July/August. I advised that she can call back and schedule a later date unless something acutely needed in the meantime.  Pt was not happy, stated that she feels like our office should take her regardless given that her PCP left the Practice which in turn left her without a Provider, not by her choice.   Please advise if anything can be done to schedule patient with one of our providers now for TOC, thanks.

## 2020-08-28 NOTE — Telephone Encounter (Signed)
Lvm asking pt to call office to discuss. 

## 2020-09-02 NOTE — Telephone Encounter (Signed)
Pt returned call. I explained to her that while the providers in the office currently are not able to take on any new patients, we are still seeing patients for acute concerns until we can get them scheduled to transfer to one of the new providers coming in. I let her know that I can go ahead and get her scheduled with Susy Frizzle if she would like or if she would be more comfortable with a female provider, that we are currently looking at August for scheduling. Pt states she will be more comfortable with a female provider. I let her know that her schedule isn't built yet, so I will need to reach back out to her when we can start scheduling. I advised her to reach out if she has any acute concerns in the meantime as we will still be able to provide her care and schedule her with an existing provider if needed. Pt verbalized understanding.

## 2020-09-30 IMAGING — US US THYROID
1 series · 14 of 25 positions shown · non-contrast
Comparison: None.

CLINICAL DATA: Neck fullness

EXAM:
THYROID ULTRASOUND
TECHNIQUE: Ultrasound examination of the thyroid gland and adjacent soft
tissues was performed.

[Series 1: us thyroid · 0.05mm/px · 14 of 34 slices shown]
[im 1/34]
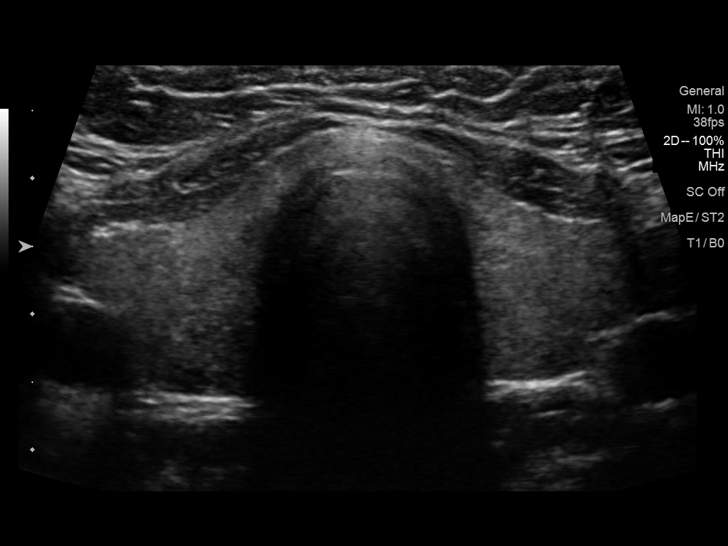
[im 3/34]
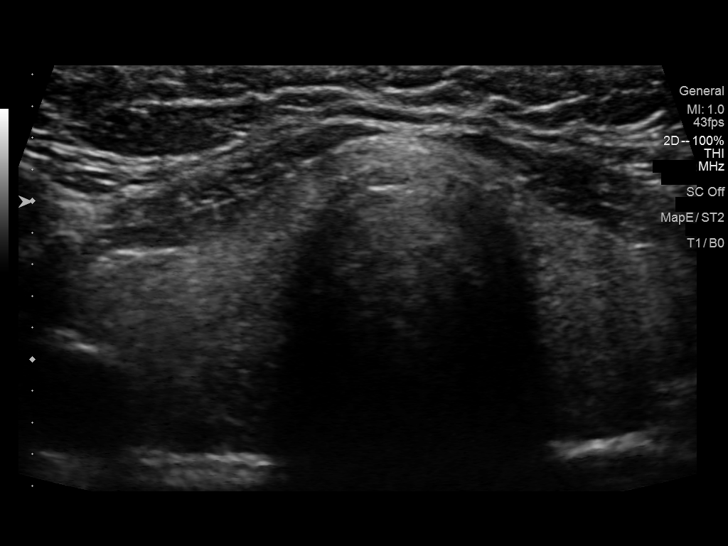
[im 6/34]
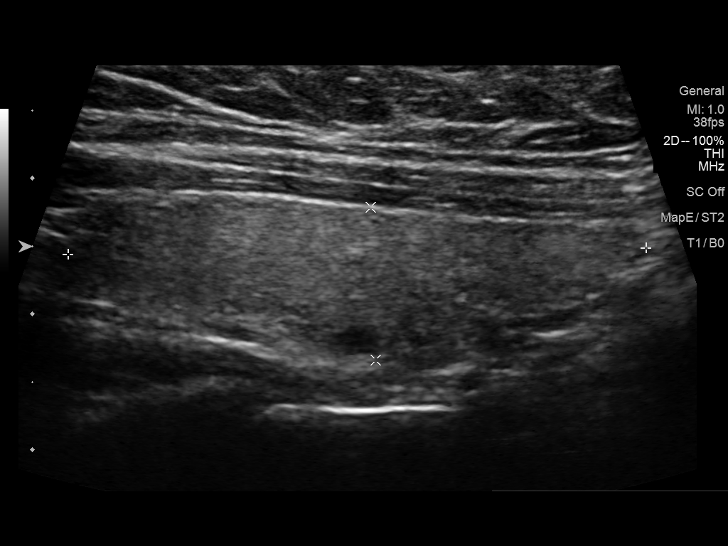
[im 9/34]
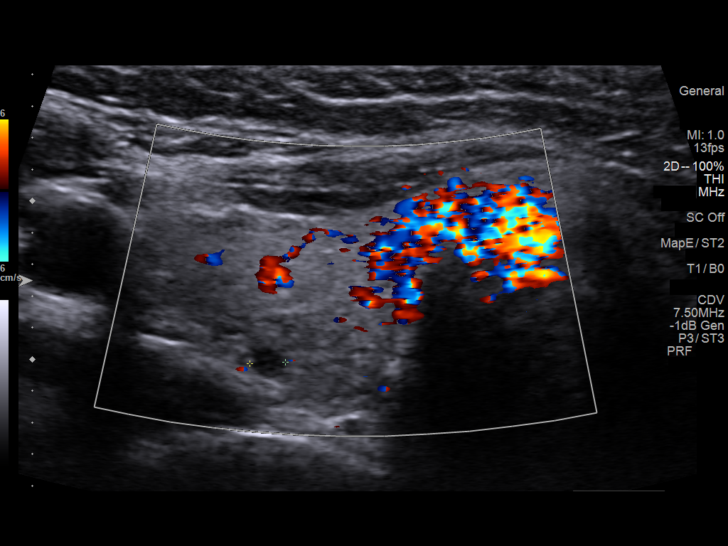
[im 12/34]
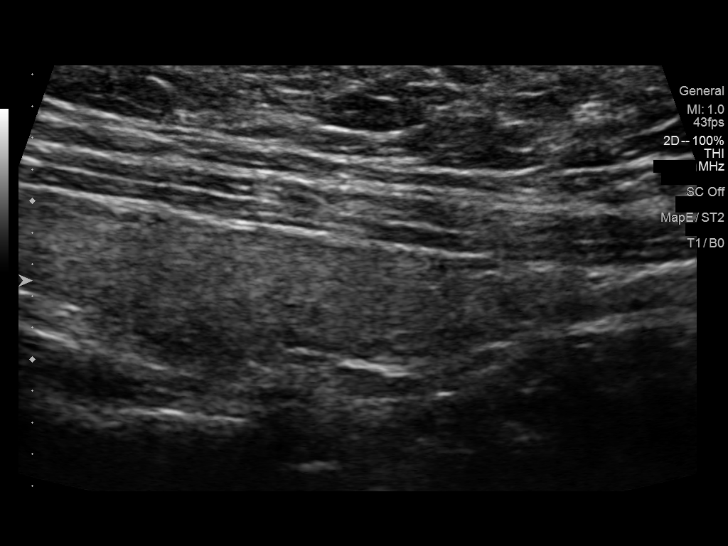
[im 13/34]
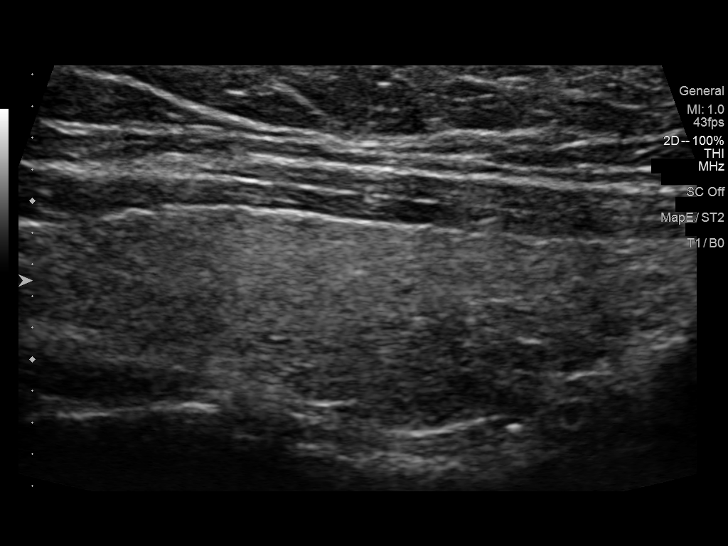
[im 16/34]
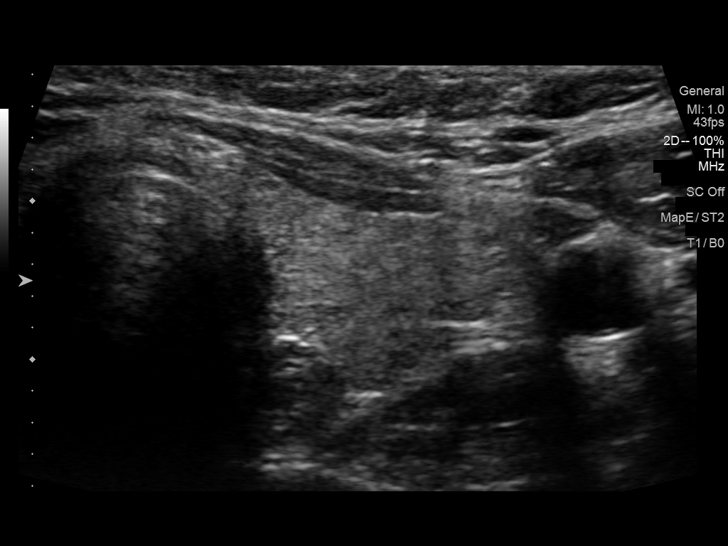
[im 18/34]
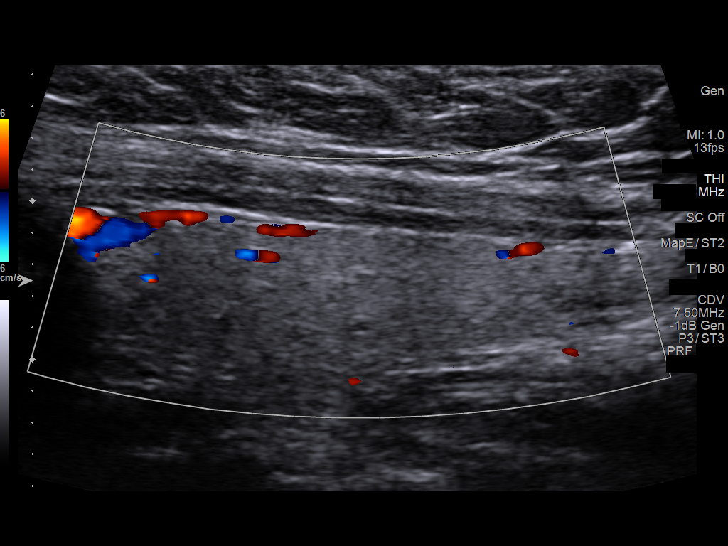
[im 21/34]
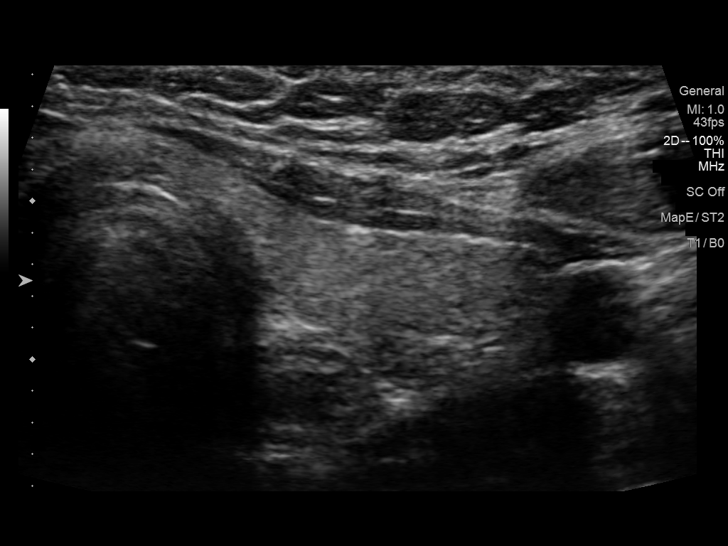
[im 23/34]
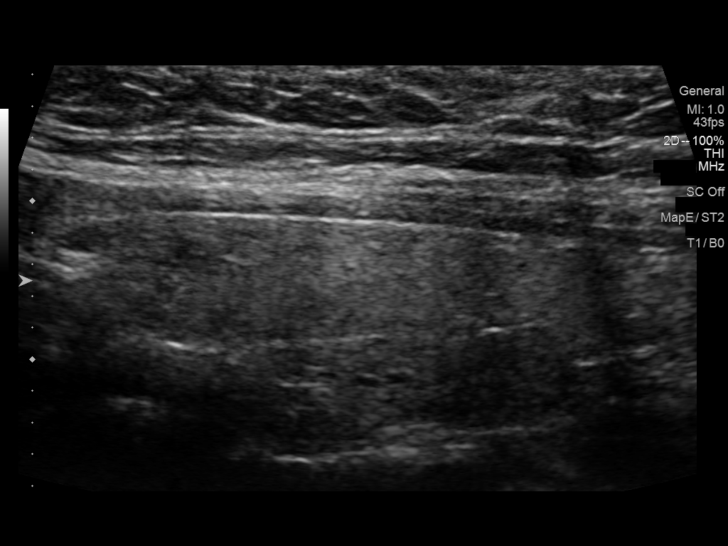
[im 25/34]
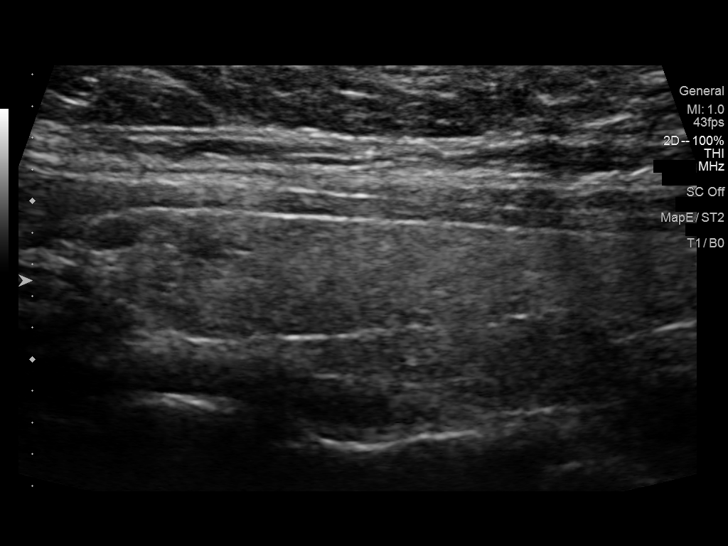
[im 28/34]
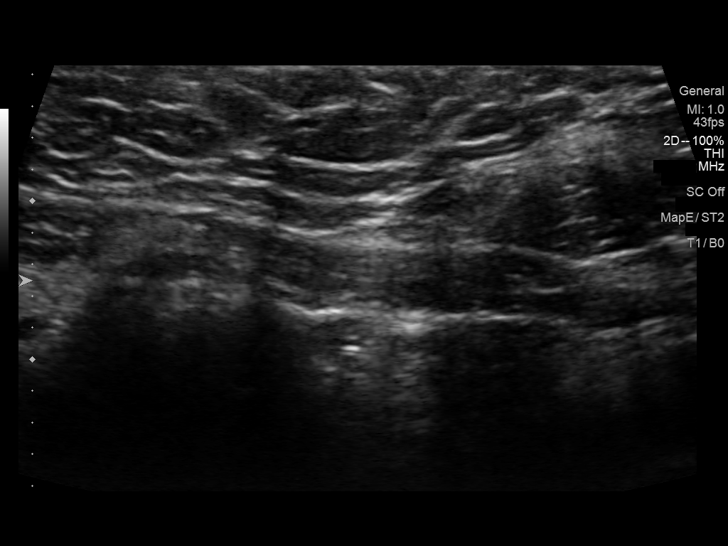
[im 31/34]
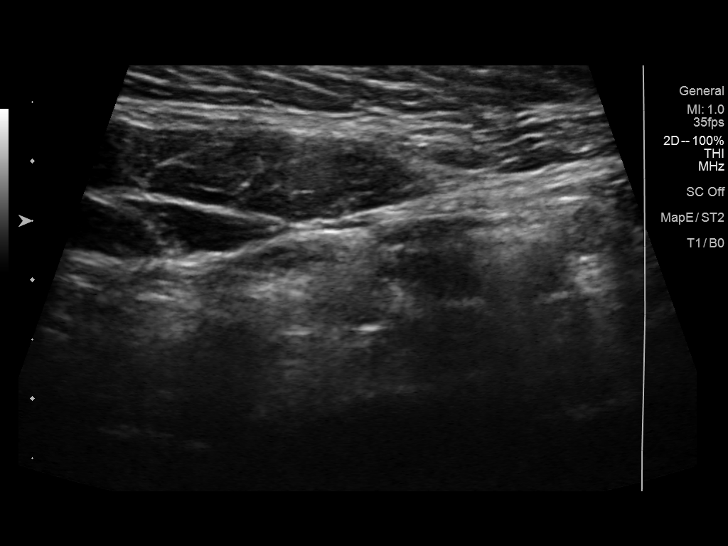
[im 34/34]
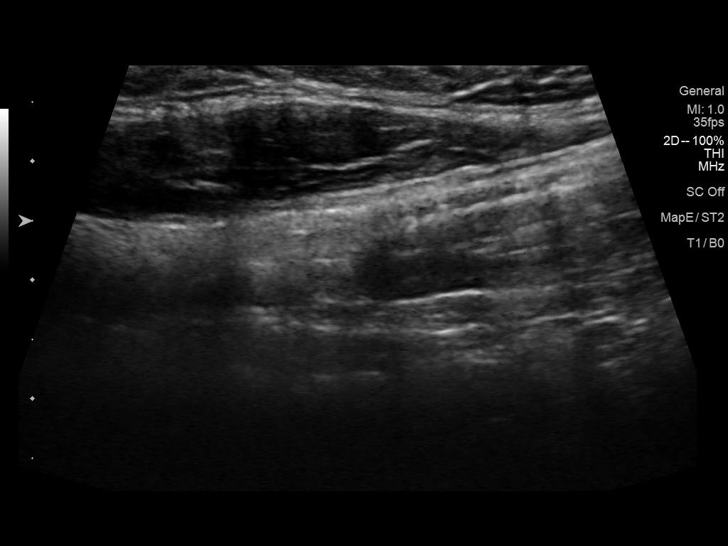

[14 of 25 positions shown; findings below may reference images not displayed]

FINDINGS: Parenchymal Echotexture: Mildly heterogenous

Isthmus: 0.2 cm thickness

Right lobe: 4.3 x 1.1 x 1.6 cm

Left lobe: 4.2 x 1.2 x 1.7 cm

_________________________________________________________

Estimated total number of nodules >/= 1 cm: 0

Number of spongiform nodules >/=  2 cm not described below (TR1): 0

Number of mixed cystic and solid nodules >/= 1.5 cm not described
below (TR2): 0

_________________________________________________________

No discrete nodules are seen within the thyroid gland. No cervical
adenopathy is identified on limited interrogation.
IMPRESSION: Normal-sized thyroid.  No nodule

The above is in keeping with the ACR TI-RADS recommendations - [HOSPITAL] 7799;[DATE].

## 2021-07-27 ENCOUNTER — Ambulatory Visit: Payer: No Typology Code available for payment source | Admitting: Family

## 2021-09-17 ENCOUNTER — Encounter: Payer: Self-pay | Admitting: Family

## 2021-09-17 ENCOUNTER — Ambulatory Visit (INDEPENDENT_AMBULATORY_CARE_PROVIDER_SITE_OTHER): Payer: No Typology Code available for payment source | Admitting: Family

## 2021-09-17 VITALS — BP 118/64 | HR 87 | Temp 98.6°F | Resp 16 | Ht 65.0 in | Wt 211.1 lb

## 2021-09-17 DIAGNOSIS — R7989 Other specified abnormal findings of blood chemistry: Secondary | ICD-10-CM | POA: Diagnosis not present

## 2021-09-17 DIAGNOSIS — Z13220 Encounter for screening for lipoid disorders: Secondary | ICD-10-CM

## 2021-09-17 DIAGNOSIS — E559 Vitamin D deficiency, unspecified: Secondary | ICD-10-CM

## 2021-09-17 DIAGNOSIS — Z23 Encounter for immunization: Secondary | ICD-10-CM

## 2021-09-17 DIAGNOSIS — E282 Polycystic ovarian syndrome: Secondary | ICD-10-CM

## 2021-09-17 DIAGNOSIS — E7800 Pure hypercholesterolemia, unspecified: Secondary | ICD-10-CM | POA: Diagnosis not present

## 2021-09-17 DIAGNOSIS — N946 Dysmenorrhea, unspecified: Secondary | ICD-10-CM | POA: Insufficient documentation

## 2021-09-17 DIAGNOSIS — Z0001 Encounter for general adult medical examination with abnormal findings: Secondary | ICD-10-CM | POA: Diagnosis not present

## 2021-09-17 DIAGNOSIS — Z114 Encounter for screening for human immunodeficiency virus [HIV]: Secondary | ICD-10-CM | POA: Insufficient documentation

## 2021-09-17 DIAGNOSIS — R102 Pelvic and perineal pain: Secondary | ICD-10-CM | POA: Insufficient documentation

## 2021-09-17 NOTE — Progress Notes (Signed)
Established Patient Office Visit  Subjective:  Patient ID: Angie Watkins, female    DOB: [birth year]  Age: 21 y.o. MRN: [id number]  CC:  Chief Complaint  Patient presents with   Establish Care    HPI Angie Watkins is here for a transition of care visit.  Prior provider was: Deboraha Sprang, FNP  Pt is without acute concerns.   chronic concerns:  PCOS : was on birth control which was helpful but she was diagnosed with PCOS on blood work only. Painful periods, but monthly and regular flow so far. Stopped birth control about two years.   Hypothyroidism: no fatigue. Dry skin. Sometimes she feels something in her throat, sometimes feels a lump on her throat, but doesn't swell. Last six months losing weight on purpose. Notices lately her hair seems to be thinning. Did have an [place] of her thyroid back 08/2018 and [place] was normal, no thyroid nodules.  Lab Results  Component Value Date   TSH 2.39 01/29/2019   Hirsutism: does pull the hairs from her face for cosmetic purposes.   Past Medical History:  Diagnosis Date   Hypothyroidism    PCOS (polycystic ovarian syndrome)     Past Surgical History:  Procedure Laterality Date   TONSILLECTOMY      Family History  Problem Relation Age of Onset   Hypertension Mother    Asthma Father    Diabetes Father    COPD Father        smoker   Skin cancer Maternal Grandmother    Vaginal cancer Maternal Grandmother    Alzheimer's disease Maternal Grandmother    Stroke Paternal Grandmother    Lung cancer Paternal Grandfather        smoker    Social History   Socioeconomic History   Marital status: Single    Spouse name: Not on file   Number of children: 0   Years of education: Not on file   Highest education level: Not on file  Occupational History   Occupation: inside [employer]  Tobacco Use   Smoking status: Never   Smokeless tobacco: Never  Vaping Use   Vaping Use: Never used  Substance and Sexual Activity    Alcohol use: Yes    Comment: every now and then   Drug use: Never   Sexual activity: Never    Partners: Male  Other Topics Concern   Not on file  Social History Narrative   Not on file   Social Determinants of Health   Financial Resource Strain: Not on file  Food Insecurity: Not on file  Transportation Needs: Not on file  Physical Activity: Not on file  Stress: Not on file  Social Connections: Not on file  Intimate Partner Violence: Not on file    Outpatient Medications Prior to Visit  Medication Sig Dispense Refill   ALAYCEN 1/35 tablet TAKE 1 TABLET BY MOUTH EVERY DAY 84 tablet 0   SUMAtriptan (IMITREX) 50 MG tablet May repeat in 2 hours if headache persists or recurs. No more than 2 tablets in 24 hours. 10 tablet 1   No facility-administered medications prior to visit.    No Known Allergies  ROS Review of Systems  Review of Systems  Respiratory:  Negative for shortness of breath.   Cardiovascular:  Negative for chest pain and palpitations.  Gastrointestinal:  Negative for constipation and diarrhea.  Genitourinary:  Negative for dysuria, frequency and urgency.  Musculoskeletal:  Negative for myalgias.  Psychiatric/Behavioral:  Negative for depression and suicidal ideas.   All other systems reviewed and are negative.    Objective:    Physical Exam Vitals reviewed.  Constitutional:      General: She is not in acute distress.    Appearance: Normal appearance. She is not ill-appearing or toxic-appearing.  HENT:     Right Ear: Tympanic membrane normal.     Left Ear: Tympanic membrane normal.     Mouth/Throat:     Mouth: Mucous membranes are moist.     Pharynx: No pharyngeal swelling.     Tonsils: No tonsillar exudate.  Eyes:     Extraocular Movements: Extraocular movements intact.     Conjunctiva/sclera: Conjunctivae normal.     Pupils: Pupils are equal, round, and reactive to light.  Neck:     Thyroid: No thyroid mass, thyromegaly or thyroid tenderness.   Cardiovascular:     Rate and Rhythm: Normal rate and regular rhythm.  Pulmonary:     Effort: Pulmonary effort is normal.     Breath sounds: Normal breath sounds.  Abdominal:     General: Abdomen is flat. Bowel sounds are normal.     Palpations: Abdomen is soft.  Musculoskeletal:        General: Normal range of motion.  Lymphadenopathy:     Cervical:     Right cervical: No superficial cervical adenopathy.    Left cervical: No superficial cervical adenopathy.  Skin:    General: Skin is warm.     Capillary Refill: Capillary refill takes less than 2 seconds.  Neurological:     General: No focal deficit present.     Mental Status: She is alert and oriented to person, place, and time.  Psychiatric:        Mood and Affect: Mood normal.        Behavior: Behavior normal.        Thought Content: Thought content normal.        Judgment: Judgment normal.       BP 118/64   Pulse 87   Temp 98.6 F (37 C)   Resp 16   Ht 5\' 5"  (1.651 m)   Wt 211 lb 2 oz (95.8 kg)   LMP 08/20/2021 (Exact Date)   SpO2 99%   BMI 35.13 kg/m  Wt Readings from Last 3 Encounters:  09/17/21 211 lb 2 oz (95.8 kg)  08/30/19 247 lb 12.8 oz (112.4 kg)  09/07/18 247 lb 4 oz (112.2 kg) (>99 %, Z= 2.49)*   * Growth percentiles are based on CDC (Girls, 2-20 Years) data.     Health Maintenance Due  Topic Date Due   PAP-Cervical Cytology Screening  Never done   PAP SMEAR-Modifier  Never done    There are no preventive care reminders to display for this patient.  Lab Results  Component Value Date   TSH 2.39 01/29/2019   Lab Results  Component Value Date   WBC 8.5 09/07/2018   HGB 13.6 09/07/2018   HCT 41.9 09/07/2018   MCV 85.2 09/07/2018   PLT 302 09/07/2018   Lab Results  Component Value Date   NA 138 09/04/2017   K 3.7 09/04/2017   CO2 28 09/04/2017   GLUCOSE 91 09/04/2017   BUN 7 09/04/2017   CREATININE 0.66 09/04/2017   BILITOT 0.4 09/04/2017   ALKPHOS 73 09/04/2017   AST 18  09/04/2017   ALT 21 09/04/2017   PROT 7.4 09/04/2017   ALBUMIN 4.2 09/04/2017   CALCIUM 9.3 09/04/2017  GFR 122.73 09/04/2017   Lab Results  Component Value Date   CHOL 166 09/07/2018   Lab Results  Component Value Date   HDL 46 09/07/2018   Lab Results  Component Value Date   LDLCALC 105 09/07/2018   Lab Results  Component Value Date   TRIG 66 09/07/2018   Lab Results  Component Value Date   CHOLHDL 3.6 09/07/2018   Lab Results  Component Value Date   HGBA1C 5.1 09/07/2018      Assessment & Plan:   Problem List Items Addressed This Visit       Endocrine   PCOS (polycystic ovarian syndrome)    Testosterone was elevated Ordered US pelvis pending results to confirm definitive dx of pcos       Relevant Orders   Comprehensive metabolic panel     Genitourinary   Painful menstrual periods    Ordering vaginal u/s       Relevant Orders   US PELVIC COMPLETE WITH TRANSVAGINAL   Comprehensive metabolic panel   CBC with Differential/Platelet     Other   Vitamin D deficiency    Order vitamin d pending results      Relevant Orders   VITAMIN D 25 Hydroxy (Vit-D Deficiency, Fractures)   Elevated LDL cholesterol level    Work on a low chol diet exercise as tolerated Order lipid panel pending results      Abnormal thyroid blood test - Primary    H/o elevated tsh will do order for thyroid panel pending results      Relevant Orders   Thyroid Peroxidase Antibodies (TPO) (REFL)   T3, free   T4, free   TSH   Encounter for vaccination     tdap vaccine administered in office Pt tolerated procedure well  Verbal consent obtained prior to administration  Handout given in regards to vaccination.        Relevant Orders   Tdap vaccine greater than or equal to 7yo IM (Completed)   Pelvic pain    Transvaginal u/s ordered Confirm dx pcos      Relevant Orders   Comprehensive metabolic panel   Encounter for general adult medical examination with abnormal  findings    Patient Counseling(The following topics were reviewed):  Preventative care handout given to pt  Health maintenance and immunizations reviewed. Please refer to Health maintenance section. Pt advised on safe sex, wearing seatbelts in car, and proper nutrition labwork ordered today for annual Dental health: Discussed importance of regular tooth brushing, flossing, and dental visits.        RESOLVED: Screening for lipoid disorders   Relevant Orders   Lipid panel    No orders of the defined types were placed in this encounter.   Follow-up: Return in about 1 year (around 09/18/2022) for annually for cpe .    Mort Sawyers, FNP

## 2021-09-17 NOTE — Assessment & Plan Note (Signed)
Transvaginal u/s ordered Confirm dx pcos

## 2021-09-17 NOTE — Assessment & Plan Note (Signed)
tdap vaccine administered in office Pt tolerated procedure well  Verbal consent obtained prior to administration  Handout given in regards to vaccination.   

## 2021-09-17 NOTE — Assessment & Plan Note (Signed)
Ordering vaginal u/s

## 2021-09-17 NOTE — Assessment & Plan Note (Signed)
Patient Counseling(The following topics were reviewed): ? Preventative care handout given to pt  ?Health maintenance and immunizations reviewed. Please refer to Health maintenance section. ?Pt advised on safe sex, wearing seatbelts in car, and proper nutrition ?labwork ordered today for annual ?Dental health: Discussed importance of regular tooth brushing, flossing, and dental visits. ? ? ?

## 2021-09-17 NOTE — Assessment & Plan Note (Signed)
Work on a low chol diet exercise as tolerated Order lipid panel pending results

## 2021-09-17 NOTE — Assessment & Plan Note (Signed)
H/o elevated tsh will do order for thyroid panel pending results

## 2021-09-17 NOTE — Assessment & Plan Note (Signed)
Testosterone was elevated Ordered US pelvis pending results to confirm definitive dx of pcos

## 2021-09-17 NOTE — Patient Instructions (Addendum)
Welcome to our clinic, I am happy to have you as my new patient. I am excited to continue on this healthcare journey with you.   Please call  [facility] to schedule your ultrasound of the pelvis, transvaginal.  [address] [facility] Phone [phone],  8-430 pm   Stop by the lab prior to leaving today. I will notify you of your results once received.   Please keep in mind Any my chart messages you send have up to a three business day turnaround for a response.  Phone calls may take up to a one full business day turnaround for a  response.   Please work on the following to help with sleep:  -Sleep only long enough to feel rested then get out of bed -Go to bed and get up at the same time every day. -Do not try to force yourself to sleep. If you can't sleep, get out of bed and try again later. -Have coffee, tea, and other foods that have caffeine only in the morning. -Avoid alcohol -Keep your bedroom dark, cool, quiet, and free of reminders of work or other things that cause you stress -Exercise several days a week, but not right before bed -Avoid looking at phones or reading devices ("e-books") that give off light before bed. This can make it harder to fall asleep  If you need a medication refill I recommend you request it through the pharmacy as this is easiest for [place] rather than sending a message and or phone call.   Due to recent changes in healthcare laws, you may see results of your imaging and/or laboratory studies on MyChart before I have had a chance to review them.  I understand that in some cases there may be results that are confusing or concerning to you. Please understand that not all results are received at the same time and often I may need to interpret multiple results in order to provide you with the best plan of care or course of treatment. Therefore, I ask that you please give me 2 business days to thoroughly review all your results before  contacting my office for clarification. Should we see a critical lab result, you will be contacted sooner.   It was a pleasure seeing you today! Please do not hesitate to reach out with any questions and or concerns.  Regards,   Mort Sawyers FNP-C

## 2021-09-17 NOTE — Assessment & Plan Note (Signed)
Order vitamin d pending results

## 2021-09-20 ENCOUNTER — Other Ambulatory Visit: Payer: Self-pay | Admitting: Family

## 2021-09-20 DIAGNOSIS — E559 Vitamin D deficiency, unspecified: Secondary | ICD-10-CM

## 2021-09-20 LAB — CBC WITH DIFFERENTIAL/PLATELET
Absolute Monocytes: 607 cells/uL (ref 200–950)
Basophils Absolute: 59 cells/uL (ref 0–200)
Basophils Relative: 0.8 %
Eosinophils Absolute: 59 cells/uL (ref 15–500)
Eosinophils Relative: 0.8 %
HCT: 43.2 % (ref 35.0–45.0)
Hemoglobin: 14.5 g/dL (ref 11.7–15.5)
Lymphs Abs: 1991 cells/uL (ref 850–3900)
MCH: 29.7 pg (ref 27.0–33.0)
MCHC: 33.6 g/dL (ref 32.0–36.0)
MCV: 88.3 fL (ref 80.0–100.0)
MPV: 12 fL (ref 7.5–12.5)
Monocytes Relative: 8.2 %
Neutro Abs: 4684 cells/uL (ref 1500–7800)
Neutrophils Relative %: 63.3 %
Platelets: 258 10*3/uL (ref 140–400)
RBC: 4.89 10*6/uL (ref 3.80–5.10)
RDW: 12.5 % (ref 11.0–15.0)
Total Lymphocyte: 26.9 %
WBC: 7.4 10*3/uL (ref 3.8–10.8)

## 2021-09-20 LAB — TSH: TSH: 1.93 mIU/L

## 2021-09-20 LAB — THYROID PEROXIDASE ANTIBODIES (TPO) (REFL): Thyroperoxidase Ab SerPl-aCnc: 1 IU/mL (ref <9)

## 2021-09-20 LAB — COMPREHENSIVE METABOLIC PANEL
AG Ratio: 1.6 (calc) (ref 1.0–2.5)
ALT: 16 U/L (ref 6–29)
AST: 13 U/L (ref 10–30)
Albumin: 4.5 g/dL (ref 3.6–5.1)
Alkaline phosphatase (APISO): 59 U/L (ref 31–125)
BUN: 9 mg/dL (ref 7–25)
CO2: 24 mmol/L (ref 20–32)
Calcium: 9.4 mg/dL (ref 8.6–10.2)
Chloride: 104 mmol/L (ref 98–110)
Creat: 0.69 mg/dL (ref 0.50–0.96)
Globulin: 2.9 g/dL (calc) (ref 1.9–3.7)
Glucose, Bld: 83 mg/dL (ref 65–99)
Potassium: 4.8 mmol/L (ref 3.5–5.3)
Sodium: 138 mmol/L (ref 135–146)
Total Bilirubin: 0.6 mg/dL (ref 0.2–1.2)
Total Protein: 7.4 g/dL (ref 6.1–8.1)

## 2021-09-20 LAB — LIPID PANEL
Cholesterol: 126 mg/dL (ref <200)
HDL: 44 mg/dL — ABNORMAL LOW (ref >=50)
LDL Cholesterol (Calc): 69 mg/dL (calc)
Non-HDL Cholesterol (Calc): 82 mg/dL (calc) (ref <130)
Total CHOL/HDL Ratio: 2.9 (calc) (ref <5.0)
Triglycerides: 58 mg/dL (ref <150)

## 2021-09-20 LAB — VITAMIN D 25 HYDROXY (VIT D DEFICIENCY, FRACTURES): Vit D, 25-Hydroxy: 18 ng/mL — ABNORMAL LOW (ref 30–100)

## 2021-09-20 LAB — T3, FREE: T3, Free: 3.2 pg/mL (ref 2.3–4.2)

## 2021-09-20 LAB — T4, FREE: Free T4: 1.1 ng/dL (ref 0.8–1.8)

## 2021-09-20 MED ORDER — VITAMIN D (ERGOCALCIFEROL) 1.25 MG (50000 UNIT) PO CAPS: 50000.0000 [IU] | ORAL_CAPSULE | ORAL | 0 refills | Status: AC

## 2021-09-20 NOTE — Progress Notes (Signed)
Your vitamin D was a bit on the lower range. I will send an RX for vitamin D3 50,000 IU which you will take once weekly for 8 weeks. Once RX is complete, please continue over the counter Vitamin D3 1000 IU once daily. Return to the clinic for a follow up in three months to repeat your Vitamin D level.   Cholesterol looks great Other labs including thyroid look good

## 2021-09-21 ENCOUNTER — Encounter: Payer: Self-pay | Admitting: *Deleted

## 2021-10-04 ENCOUNTER — Other Ambulatory Visit: Payer: Self-pay | Admitting: Family

## 2021-10-04 DIAGNOSIS — E559 Vitamin D deficiency, unspecified: Secondary | ICD-10-CM
